# Patient Record
Sex: Female | Born: 1965 | Race: White | Hispanic: No | Marital: Married | Smoking: Never smoker
Health system: Southern US, Community
[De-identification: ages and names within clinical notes are randomized; demographics above are authoritative.]

## PROBLEM LIST (undated history)

## (undated) DIAGNOSIS — I10 Essential (primary) hypertension: Secondary | ICD-10-CM

---

## 2006-09-05 ENCOUNTER — Ambulatory Visit (HOSPITAL_BASED_OUTPATIENT_CLINIC_OR_DEPARTMENT_OTHER): Admission: RE | Admit: 2006-09-05 | Discharge: 2006-09-05 | Payer: Self-pay | Admitting: Surgery

## 2007-03-14 ENCOUNTER — Emergency Department: Payer: Self-pay

## 2007-03-24 ENCOUNTER — Emergency Department: Payer: Self-pay | Admitting: Emergency Medicine

## 2007-09-03 ENCOUNTER — Emergency Department: Payer: Self-pay | Admitting: Emergency Medicine

## 2011-12-29 ENCOUNTER — Emergency Department: Payer: Self-pay | Admitting: Emergency Medicine

## 2011-12-29 LAB — CBC
HCT: 44.9 % (ref 35.0–47.0)
HGB: 14.5 g/dL (ref 12.0–16.0)
MCH: 30.6 pg (ref 26.0–34.0)
MCHC: 32.3 g/dL (ref 32.0–36.0)
Platelet: 206 10*3/uL (ref 150–440)
RBC: 4.74 10*6/uL (ref 3.80–5.20)
RDW: 13.2 % (ref 11.5–14.5)
WBC: 11 10*3/uL (ref 3.6–11.0)

## 2011-12-29 LAB — COMPREHENSIVE METABOLIC PANEL
Albumin: 3.9 g/dL (ref 3.4–5.0)
Alkaline Phosphatase: 59 U/L (ref 50–136)
Anion Gap: 10 (ref 7–16)
BUN: 13 mg/dL (ref 7–18)
Bilirubin,Total: 0.5 mg/dL (ref 0.2–1.0)
Chloride: 104 mmol/L (ref 98–107)
Co2: 28 mmol/L (ref 21–32)
Creatinine: 1.01 mg/dL (ref 0.60–1.30)
EGFR (African American): >60
EGFR (Non-African Amer.): >60
Glucose: 109 mg/dL — ABNORMAL HIGH (ref 65–99)
Osmolality: 284 (ref 275–301)
SGOT(AST): 22 U/L (ref 15–37)
Sodium: 142 mmol/L (ref 136–145)
Total Protein: 7.3 g/dL (ref 6.4–8.2)

## 2011-12-29 LAB — TROPONIN I: Troponin-I: <0.02 ng/mL

## 2011-12-29 LAB — CK TOTAL AND CKMB (NOT AT ARMC): CK-MB: 2.4 ng/mL (ref 0.5–3.6)

## 2012-04-18 ENCOUNTER — Telehealth: Payer: Self-pay | Admitting: *Deleted

## 2012-04-18 NOTE — Telephone Encounter (Signed)
Spoke with patient and informed her that I was faxing over the prior authorization for the medication we discussed now. Dr. Jennette Kettle filled out form and gave to me to fax.

## 2012-06-18 ENCOUNTER — Telehealth: Payer: Self-pay | Admitting: *Deleted

## 2012-06-18 NOTE — Telephone Encounter (Signed)
ERROR

## 2013-02-19 ENCOUNTER — Emergency Department: Payer: Self-pay | Admitting: Emergency Medicine

## 2013-05-02 ENCOUNTER — Emergency Department: Payer: Self-pay | Admitting: Emergency Medicine

## 2015-03-19 ENCOUNTER — Other Ambulatory Visit: Payer: Self-pay | Admitting: Physician Assistant

## 2015-03-19 DIAGNOSIS — R634 Abnormal weight loss: Secondary | ICD-10-CM

## 2015-03-19 DIAGNOSIS — R61 Generalized hyperhidrosis: Secondary | ICD-10-CM

## 2015-03-25 ENCOUNTER — Ambulatory Visit
Admission: RE | Admit: 2015-03-25 | Discharge: 2015-03-25 | Disposition: A | Discharge status: Home / Self Care | Payer: PRIVATE HEALTH INSURANCE | Source: Ambulatory Visit | Attending: Physician Assistant | Admitting: Physician Assistant

## 2015-03-25 DIAGNOSIS — R918 Other nonspecific abnormal finding of lung field: Secondary | ICD-10-CM | POA: Insufficient documentation

## 2015-03-25 DIAGNOSIS — I708 Atherosclerosis of other arteries: Secondary | ICD-10-CM | POA: Diagnosis not present

## 2015-03-25 DIAGNOSIS — R634 Abnormal weight loss: Secondary | ICD-10-CM | POA: Diagnosis present

## 2015-03-25 DIAGNOSIS — M899 Disorder of bone, unspecified: Secondary | ICD-10-CM | POA: Insufficient documentation

## 2015-03-25 DIAGNOSIS — R61 Generalized hyperhidrosis: Secondary | ICD-10-CM | POA: Diagnosis present

## 2015-03-25 HISTORY — DX: Essential (primary) hypertension: I10

## 2015-03-25 MED ORDER — IOHEXOL 350 MG/ML SOLN
100.0000 mL | Freq: Once | INTRAVENOUS | Status: AC | PRN
  Administered 2015-03-25: 100 mL via INTRAVENOUS

## 2015-10-17 ENCOUNTER — Emergency Department
Admission: EM | Admit: 2015-10-17 | Discharge: 2015-10-17 | Disposition: A | Discharge status: Home / Self Care | Payer: Managed Care, Other (non HMO) | Attending: Emergency Medicine | Admitting: Emergency Medicine

## 2015-10-17 DIAGNOSIS — G5702 Lesion of sciatic nerve, left lower limb: Secondary | ICD-10-CM | POA: Diagnosis not present

## 2015-10-17 DIAGNOSIS — I10 Essential (primary) hypertension: Secondary | ICD-10-CM | POA: Diagnosis not present

## 2015-10-17 DIAGNOSIS — M62830 Muscle spasm of back: Secondary | ICD-10-CM | POA: Insufficient documentation

## 2015-10-17 DIAGNOSIS — M25552 Pain in left hip: Secondary | ICD-10-CM | POA: Diagnosis present

## 2015-10-17 MED ORDER — DIAZEPAM 5 MG PO TABS
5.0000 mg | ORAL_TABLET | Freq: Once | ORAL | Status: AC
  Administered 2015-10-17: 5 mg via ORAL
  Filled 2015-10-17: qty 1

## 2015-10-17 MED ORDER — KETOROLAC TROMETHAMINE 10 MG PO TABS: 10.0000 mg | ORAL_TABLET | Freq: Three times a day (TID) | ORAL | Status: AC

## 2015-10-17 MED ORDER — KETOROLAC TROMETHAMINE 30 MG/ML IJ SOLN
30.0000 mg | Freq: Once | INTRAMUSCULAR | Status: AC
  Administered 2015-10-17: 30 mg via INTRAMUSCULAR
  Filled 2015-10-17: qty 1

## 2015-10-17 MED ORDER — CYCLOBENZAPRINE HCL 5 MG PO TABS: 5.0000 mg | ORAL_TABLET | Freq: Three times a day (TID) | ORAL | Status: AC | PRN

## 2015-10-17 NOTE — Discharge Instructions (Signed)
Piriformis Syndrome With Rehab Piriformis syndrome is a condition the affects the nervous system in the area of the hip, and is characterized by pain and possibly a loss of feeling in the backside (posterior) thigh that may extend down the entire length of the leg. The symptoms are caused by an increase in pressure on the sciatic nerve by the piriformis muscle, which is on the back of the hip and is responsible for externally rotating the hip. The sciatic nerve and its branches connect to much of the leg. Normally the sciatic nerve runs between the piriformis muscle and other muscles. However, in certain individuals the nerve runs through the muscle, which causes an increase in pressure on the nerve and results in the symptoms of piriformis syndrome. SYMPTOMS   Pain, tingling, numbness, or burning in the back of the thigh that may also extend down the entire leg.  Occasionally, tenderness in the buttock.  Loss of function of the leg.  Pain that worsens when using the piriformis muscle (running, jumping, or stairs).  Pain that increases with prolonged sitting.  Pain that is lessened by lying flat on the back. CAUSES   Piriformis syndrome is the result of an increase in pressure placed on the sciatic nerve. Oftentimes, piriformis syndrome is an overuse injury.  Stress placed on the nerve from a sudden increase in the intensity, frequency, or duration of training.  Compensation of other extremity injuries. RISK INCREASES WITH:  Sports that involve the piriformis muscle (running, walking, or jumping).  You are born with (congenital) a defect in which the sciatic nerve passes through the muscle. PREVENTION  Warm up and stretch properly before activity.  Allow for adequate recovery between workouts.  Maintain physical fitness:  Strength, flexibility, and endurance.  Cardiovascular fitness. PROGNOSIS  If treated properly, the symptoms of piriformis syndrome usually resolve in 2 to 6  weeks. RELATED COMPLICATIONS   Persistent and possibly permanent pain and numbness in the lower extremity.  Weakness of the extremity that may progress to disability and inability to compete. TREATMENT  The most effective treatment for piriformis syndrome is rest from any activities that aggravate the symptoms. Ice and pain medication may help reduce pain and inflammation. The use of strengthening and stretching exercises may help reduce pain with activity. These exercises may be performed at home or with a therapist. A referral to a therapist may be given for further evaluation and treatment, such as ultrasound. Corticosteroid injections may be given to reduce inflammation that is causing pressure to be placed on the sciatic nerve. If nonsurgical (conservative) treatment is unsuccessful, then surgery may be recommended.  MEDICATION   If pain medication is necessary, then nonsteroidal anti-inflammatory medications, such as aspirin and ibuprofen, or other minor pain relievers, such as acetaminophen, are often recommended.  Do not take pain medication for 7 days before surgery.  Prescription pain relievers may be given if deemed necessary by your caregiver. Use only as directed and only as much as you need.  Corticosteroid injections may be given by your caregiver. These injections should be reserved for the most serious cases, because they may only be given a certain number of times. HEAT AND COLD:   Cold treatment (icing) relieves pain and reduces inflammation. Cold treatment should be applied for 10 to 15 minutes every 2 to 3 hours for inflammation and pain and immediately after any activity that aggravates your symptoms. Use ice packs or massage the area with a piece of ice (ice massage).  Heat   treatment may be used prior to performing the stretching and strengthening activities prescribed by your caregiver, physical therapist, or athletic trainer. Use a heat pack or soak the injury in warm  water. SEEK IMMEDIATE MEDICAL CARE IF:  Treatment seems to offer no benefit, or the condition worsens.  Any medications produce adverse side effects. EXERCISES RANGE OF MOTION (ROM) AND STRETCHING EXERCISES - Piriformis Syndrome These exercises may help you when beginning to rehabilitate your injury. Your symptoms may resolve with or without further involvement from your physician, physical therapist, or athletic trainer. While completing these exercises, remember:   Restoring tissue flexibility helps normal motion to return to the joints. This allows healthier, less painful movement and activity.  An effective stretch should be held for at least 30 seconds.  A stretch should never be painful. You should only feel a gentle lengthening or release in the stretched tissue. STRETCH - Hip Rotators  Lie on your back on a firm surface. Grasp your right / left knee with your right / left hand and your ankle with your opposite hand.  Keeping your hips and shoulders firmly planted, gently pull your right / left knee and rotate your lower leg toward your opposite shoulder until you feel a stretch in your buttocks.  Hold this stretch for __________ seconds. Repeat this stretch __________ times. Complete this stretch __________ times per day. STRETCH - Iliotibial Band  On the floor or bed, lie on your side so your right / left leg is on top. Bend your knee and grab your ankle.  Slowly bring your knee back so that your thigh is in line with your trunk. Keep your heel at your buttocks and gently arch your back so your head, shoulders, and hips line up.  Slowly lower your leg so that your knee approaches the floor/bed until you feel a gentle stretch on the outside of your right / left thigh. If you do not feel a stretch and your knee will not fall farther, place the heel of your opposite foot on top of your knee and pull your thigh down farther.  Hold this stretch for __________ seconds. Repeat  __________ times. Complete __________ times per day. STRENGTHENING EXERCISES - Piriformis Syndrome  These are some of the caregiver again or until your symptoms are resolved. Remember:   Strong muscles with good endurance tolerate stress better.  Do the exercises as initially prescribed by your caregiver. Progress slowly with each exercise, gradually increasing the number of repetitions and weight used under their guidance. STRENGTH - Hip Abductors, Straight Leg Raises Be aware of your form throughout the entire exercise so that you exercise the correct muscles. Sloppy form means that you are not strengthening the correct muscles.  Lie on your side so that your head, shoulders, knee, and hip line up. You may bend your lower knee to help maintain your balance. Your right / left leg should be on top.  Roll your hips slightly forward, so that your hips are stacked directly over each other and your right / left knee is facing forward.  Lift your top leg up 4-6 inches, leading with your heel. Be sure that your foot does not drift forward or that your knee does not roll toward the ceiling.  Hold this position for __________ seconds. You should feel the muscles in your outer hip lifting (you may not notice this until your leg begins to tire).  Slowly lower your leg to the starting position. Allow the muscles to fully   relax before beginning the next repetition. Repeat __________ times. Complete this exercise __________ times per day.  STRENGTH - Hip Abductors, Quadruped  On a firm, lightly padded surface, position yourself on your hands and knees. Your hands should be directly below your shoulders and your knees should be directly below your hips.  Keeping your right / left knee bent, lift your leg out to the side. Keep your legs level and in line with your shoulders.  Position yourself on your hands and knees.  Hold for __________ seconds.  Keeping your trunk steady and your hips level, slowly  lower your leg to the starting position. Repeat __________ times. Complete this exercise __________ times per day.  STRENGTH - Hip Abductors, Standing  Tie one end of a rubber exercise band/tubing to a secure surface (table, pole) and tie a loop at the other end.  Place the loop around your right / left ankle. Keeping your ankle with the band directly opposite of the secured end, step away until there is tension in the tube/band.  Hold onto a chair as needed for balance.  Keeping your back upright, your shoulders over your hips, and your toes pointing forward, lift your right / left leg out to your side. Be sure to lift your leg with your hip muscles. Do not "throw" your leg or tip your body to lift your leg.  Slowly and with control, return to the starting position. Repeat exercise __________ times. Complete this exercise __________ times per day.    This information is not intended to replace advice given to you by your health care provider. Make sure you discuss any questions you have with your health care provider.   Document Released: 07/24/2005 Document Revised: 12/08/2014 Document Reviewed: 11/05/2008 Elsevier Interactive Patient Education 2016 [employer]   Take the prescription meds as directed. Apply ice to the sore muscles. Follow-up with Dr. Quillian QuinceBliss or Dr. Ernest PineHooten for continued symptoms.

## 2015-10-17 NOTE — ED Notes (Signed)
Pt sts that she was moving furniture last week and pain started then, but has gradually gotten worse.  Sts ibuprofen and heating pad ineffective.  Took a muscle relaxer w/ little relief.

## 2015-10-17 NOTE — ED Notes (Signed)
Pt reports left hip pain x 1 week. Pt denies injury but reports chronic pain

## 2015-10-17 NOTE — ED Provider Notes (Signed)
[facility] Emergency Department Provider Note ____________________________________________  Time seen: 1247  I have reviewed the triage vital signs and the nursing notes.  HISTORY  Chief Complaint  Hip Pain  HPI Jodi SimmondsMelissa D Pariseau is a 50 y.o. female presents to the ED for pain to the left buttocks for the last week. She describes onset of pain following her lip and a very large hutch with a girlfriend. She denies fall, slip, trip related to moving the furniture. She also denies any immediate pain, she describes onset about 2 days after moving furniture. She denies any distal paresthesias, foot drop, or incontinence. She does report pain in the left buttocks that is worsened with prolonged sitting and transition from sit to stand. She reports her pain at 6/10 in triage.   Past Medical History  Diagnosis Date  . Hypertension    There are no active problems to display for this patient.  No past surgical history on file.  Current Outpatient Rx  Name  Route  Sig  Dispense  Refill  . cyclobenzaprine (FLEXERIL) 5 MG tablet   Oral   Take 1 tablet (5 mg total) by mouth every 8 (eight) hours as needed for muscle spasms.   12 tablet   0   . ketorolac (TORADOL) 10 MG tablet   Oral   Take 1 tablet (10 mg total) by mouth every 8 (eight) hours.   15 tablet   0    Allergies Morphine and related  No family history on file.  Social History Social History  Substance Use Topics  . Smoking status: Not on file  . Smokeless tobacco: Not on file  . Alcohol Use: Not on file   Review of Systems  Constitutional: Negative for fever. Eyes: Negative for visual changes. ENT: Negative for sore throat. Cardiovascular: Negative for chest pain. Respiratory: Negative for shortness of breath. Gastrointestinal: Negative for abdominal pain, vomiting and diarrhea. Genitourinary: Negative for dysuria. Musculoskeletal: Negative for back pain. Skin: Negative for  rash. Neurological: Negative for headaches, focal weakness or numbness. ____________________________________________  PHYSICAL EXAM:  VITAL SIGNS: ED Triage Vitals  Enc Vitals Group     BP 10/17/15 1114 146/94 mmHg     Pulse Rate 10/17/15 1114 74     Resp 10/17/15 1114 18     Temp 10/17/15 1114 97.7 F (36.5 C)     Temp src --      SpO2 10/17/15 1114 98 %     Weight 10/17/15 1114 185 lb (83.915 kg)     Height --      Head Cir --      Peak Flow --      Pain Score 10/17/15 1115 10     Pain Loc --      Pain Edu? --      Excl. in GC? --    Constitutional: Alert and oriented. Well appearing and in no distress. Head: Normocephalic and atraumatic.      Eyes: Conjunctivae are normal. PERRL. Normal extraocular movements      Ears: Canals clear. TMs intact bilaterally.   Nose: No congestion/rhinorrhea.   Mouth/Throat: Mucous membranes are moist.   Neck: Supple. No thyromegaly. Hematological/Lymphatic/Immunological: No cervical lymphadenopathy. Cardiovascular: Normal rate, regular rhythm.  Respiratory: Normal respiratory effort. No wheezes/rales/rhonchi. Gastrointestinal: Soft and nontender. No distention. Musculoskeletal: Patient with normal spinal alignment without midline tenderness, spasm, deformity, step-off. She is with tenderness to palpation over the left SI joint region. Tenderness significantly over the gluteal musculature. Patient with  normal hip flexion and extension range negative straight leg raise in the supine position. Nontender with normal range of motion in all extremities.  Neurologic: Cranial nerves II through XII grossly intact. Normal LE DTRs bilaterally. Normal gait without ataxia. Normal speech and language. No gross focal neurologic deficits are appreciated. Skin:  Skin is warm, dry and intact. No rash noted. Psychiatric: Mood and affect are normal. Patient exhibits appropriate insight and judgment. ____________________________________________    RADIOLOGY  deferred ____________________________________________  PROCEDURES  Toradol 30 mg IM Valium 5 mg PO ____________________________________________  INITIAL IMPRESSION / ASSESSMENT AND PLAN / ED COURSE  Patient's exam is consistent with an acute left piriformis syndrome causing irritation of the left sciatic nerve root. She'll be discharged with prescriptions for ketorolac and Flexeril to dose as directed. She is encouraged to apply ice to the area for symptom management. He is also encouraged to change positions often at rest with the legs elevated when seated. She will follow with a primary care provider or Dr. Ernest Pine for ongoing symptom management. Patient reports symptoms improved following injection and by mouth pain medicines in the ED. Return precautions are reviewed. ____________________________________________  FINAL CLINICAL IMPRESSION(S) / ED DIAGNOSES  Final diagnoses:  Piriformis syndrome, left      Lissa Hoard, PA-C 10/17/15 1535  Jene Every, MD 10/17/15 1540

## 2015-11-18 ENCOUNTER — Ambulatory Visit: Payer: Managed Care, Other (non HMO) | Admitting: Physical Therapy

## 2015-11-22 ENCOUNTER — Encounter: Payer: Managed Care, Other (non HMO) | Admitting: Physical Therapy

## 2015-11-24 ENCOUNTER — Encounter: Payer: Managed Care, Other (non HMO) | Admitting: Physical Therapy

## 2015-11-29 ENCOUNTER — Encounter: Payer: Managed Care, Other (non HMO) | Admitting: Physical Therapy

## 2015-12-02 ENCOUNTER — Encounter: Payer: Managed Care, Other (non HMO) | Admitting: Physical Therapy

## 2015-12-06 ENCOUNTER — Encounter: Payer: Managed Care, Other (non HMO) | Admitting: Physical Therapy

## 2015-12-08 ENCOUNTER — Encounter: Payer: Managed Care, Other (non HMO) | Admitting: Physical Therapy

## 2015-12-13 ENCOUNTER — Encounter: Payer: Managed Care, Other (non HMO) | Admitting: Physical Therapy

## 2015-12-15 ENCOUNTER — Encounter: Payer: Managed Care, Other (non HMO) | Admitting: Physical Therapy

## 2018-03-13 ENCOUNTER — Other Ambulatory Visit: Payer: Self-pay

## 2018-03-13 ENCOUNTER — Emergency Department
Admission: EM | Admit: 2018-03-13 | Discharge: 2018-03-13 | Disposition: A | Discharge status: Home / Self Care | Payer: BLUE CROSS/BLUE SHIELD | Attending: Emergency Medicine | Admitting: Emergency Medicine

## 2018-03-13 ENCOUNTER — Encounter: Payer: Self-pay | Admitting: Emergency Medicine

## 2018-03-13 ENCOUNTER — Emergency Department: Payer: BLUE CROSS/BLUE SHIELD

## 2018-03-13 DIAGNOSIS — I10 Essential (primary) hypertension: Secondary | ICD-10-CM | POA: Insufficient documentation

## 2018-03-13 DIAGNOSIS — M79671 Pain in right foot: Secondary | ICD-10-CM | POA: Diagnosis not present

## 2018-03-13 DIAGNOSIS — M79672 Pain in left foot: Secondary | ICD-10-CM

## 2018-03-13 MED ORDER — ONDANSETRON 4 MG PO TBDP
4.0000 mg | ORAL_TABLET | Freq: Once | ORAL | Status: AC
  Administered 2018-03-13: 4 mg via ORAL
  Filled 2018-03-13: qty 1

## 2018-03-13 MED ORDER — HYDROCODONE-ACETAMINOPHEN 5-325 MG PO TABS: 1.0000 | ORAL_TABLET | Freq: Four times a day (QID) | ORAL | 0 refills | Status: AC | PRN

## 2018-03-13 MED ORDER — HYDROCODONE-ACETAMINOPHEN 5-325 MG PO TABS
1.0000 | ORAL_TABLET | Freq: Once | ORAL | Status: AC
Start: 2018-03-13 — End: 2018-03-13
  Administered 2018-03-13: 1 via ORAL
  Filled 2018-03-13: qty 1
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
## 2018-03-13 NOTE — ED Notes (Signed)
Pt to be transported home by husband at bedside.
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
## 2018-09-02 ENCOUNTER — Other Ambulatory Visit: Payer: Self-pay | Admitting: Internal Medicine

## 2018-09-02 DIAGNOSIS — R928 Other abnormal and inconclusive findings on diagnostic imaging of breast: Secondary | ICD-10-CM

## 2018-09-04 ENCOUNTER — Other Ambulatory Visit: Payer: Self-pay | Admitting: Internal Medicine

## 2018-09-04 DIAGNOSIS — R928 Other abnormal and inconclusive findings on diagnostic imaging of breast: Secondary | ICD-10-CM

## 2019-04-17 IMAGING — DX DG ANKLE COMPLETE 3+V*R*
3 series · 3 of 3 positions shown · non-contrast
Comparison: None.

CLINICAL DATA: Patient felt pop in the right heel and arch.

EXAM:
RIGHT ANKLE - COMPLETE 3+ VIEW

[ankle ap]
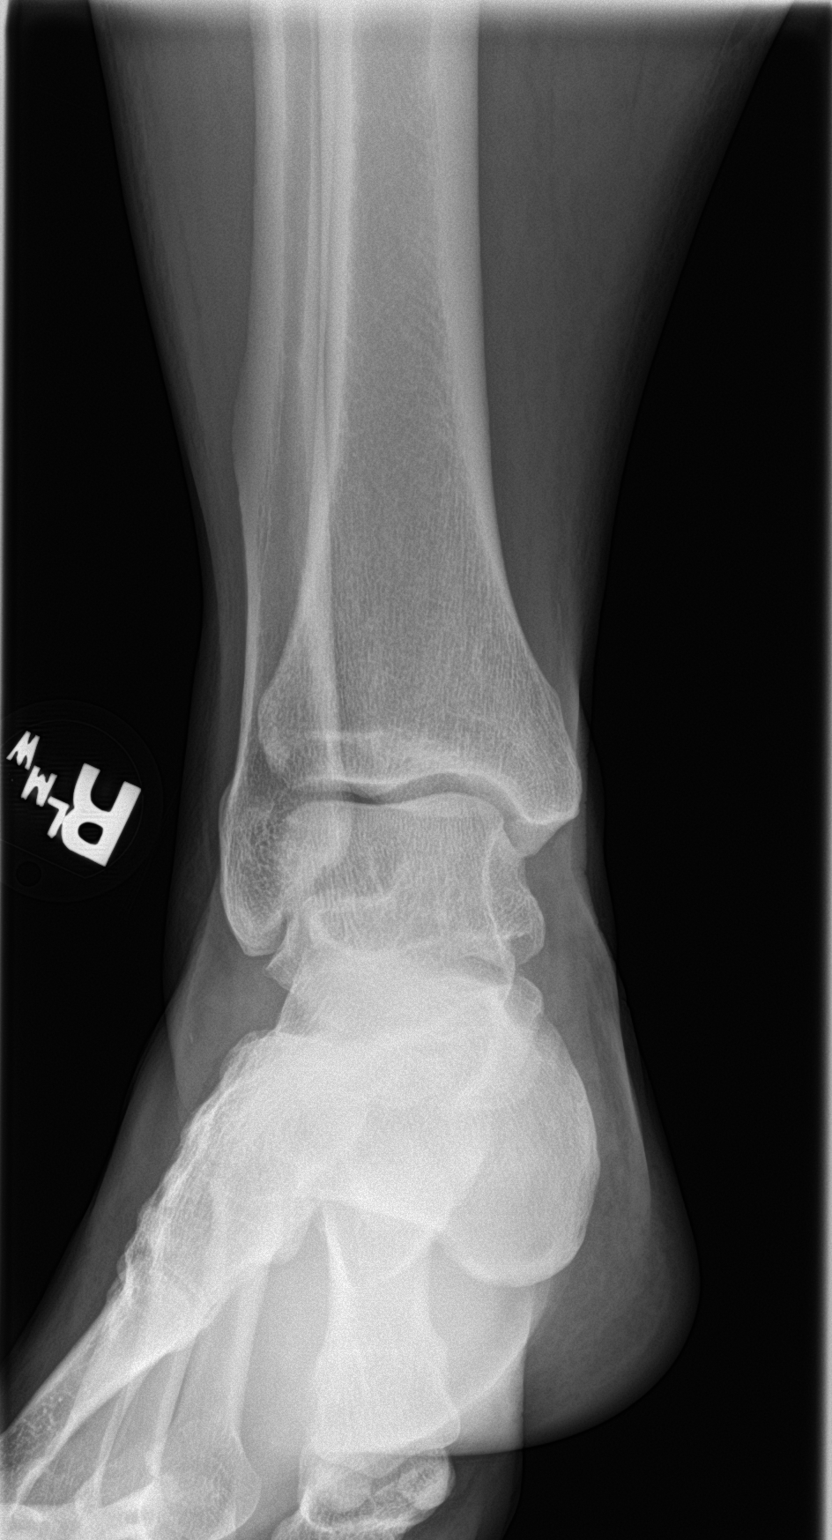

[ankle obl]
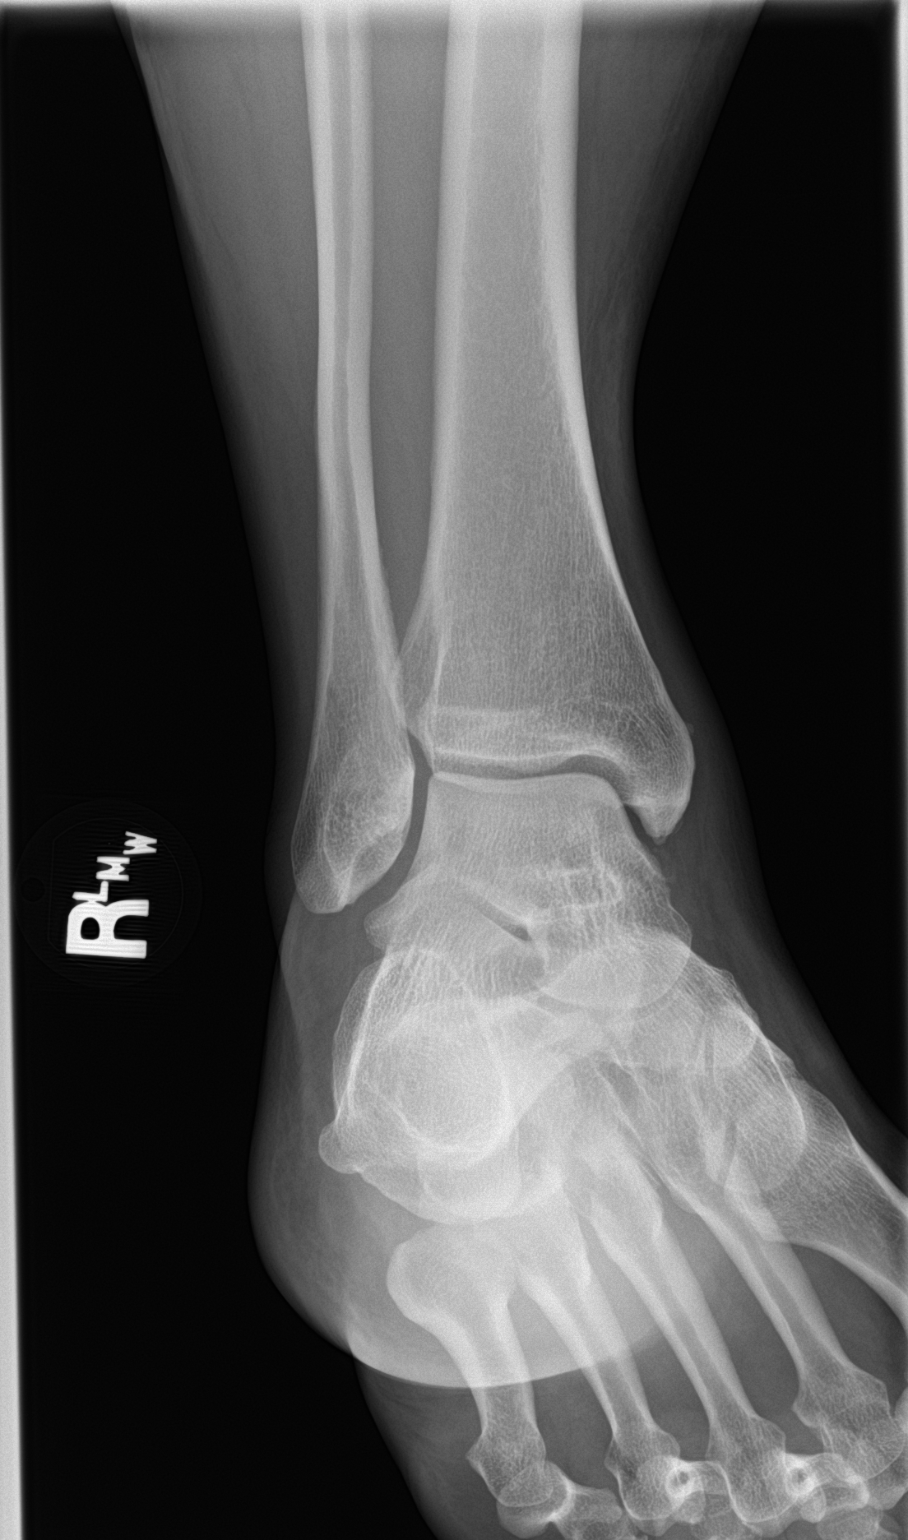

[ankle lat]
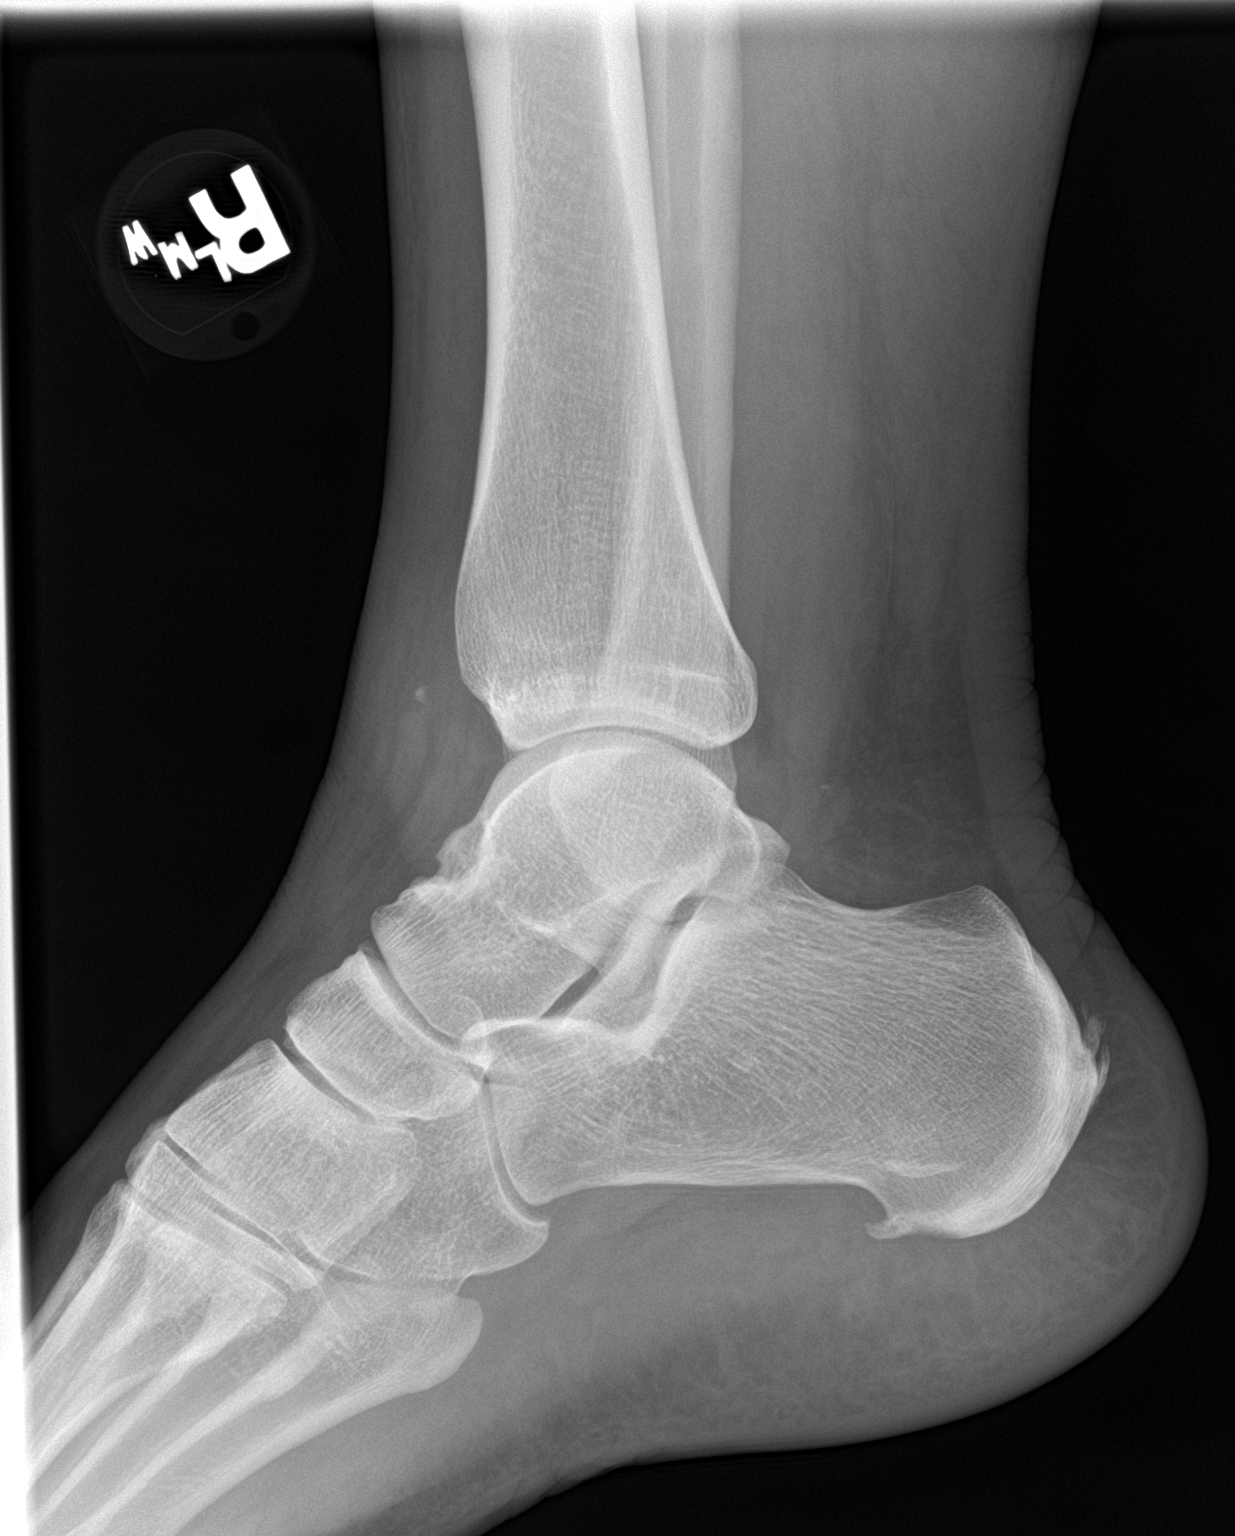

[3 of 3 positions shown; findings below may reference images not displayed]

FINDINGS: Prominent plantar and dorsal calcaneal enthesophytes are noted
measuring up to 6 mm in length. There is mild soft tissue swelling
over the anterior aspect of the ankle joint. No ankle joint
effusion, fracture or malalignment is identified. The subtalar and
midfoot articulations are intact. No induration of Kager's fat pad.
IMPRESSION: Mild soft tissue swelling of the right ankle without acute osseous
abnormality. Calcaneal enthesopathy.
